# Patient Record
Sex: Male | Born: 1950 | Race: White | Hispanic: No | Smoking: Never smoker
Health system: Southern US, Community
[De-identification: ages and names within clinical notes are randomized; demographics above are authoritative.]

## PROBLEM LIST (undated history)

## (undated) DIAGNOSIS — I7101 Dissection of ascending aorta: Secondary | ICD-10-CM

## (undated) DIAGNOSIS — I10 Essential (primary) hypertension: Secondary | ICD-10-CM

## (undated) DIAGNOSIS — R55 Syncope and collapse: Secondary | ICD-10-CM

## (undated) DIAGNOSIS — E785 Hyperlipidemia, unspecified: Secondary | ICD-10-CM

## (undated) HISTORY — DX: Essential (primary) hypertension: I10

## (undated) HISTORY — DX: Syncope and collapse: R55

## (undated) HISTORY — DX: Hyperlipidemia, unspecified: E78.5

## (undated) HISTORY — DX: Dissection of ascending aorta: I71.010

## (undated) HISTORY — PX: CATARACT EXTRACTION: SUR2

## (undated) HISTORY — DX: Dissection of thoracic aorta: I71.01

---

## 2006-06-27 HISTORY — PX: BENTALL PROCEDURE: SHX5058

## 2006-07-08 ENCOUNTER — Inpatient Hospital Stay (HOSPITAL_COMMUNITY): Admission: EM | Admit: 2006-07-08 | Discharge: 2006-07-13 | Payer: Self-pay | Admitting: Emergency Medicine

## 2007-07-11 IMAGING — CR DG CHEST 1V PORT
1 series · 1 of 1 positions shown · non-contrast
Comparison: none

CLINICAL DATA: Chest pain.  Thoracic dissection.  
 PORTABLE CHEST ? 1 VIEW, 07/10/06, 5855 HOURS:

[view not recorded]
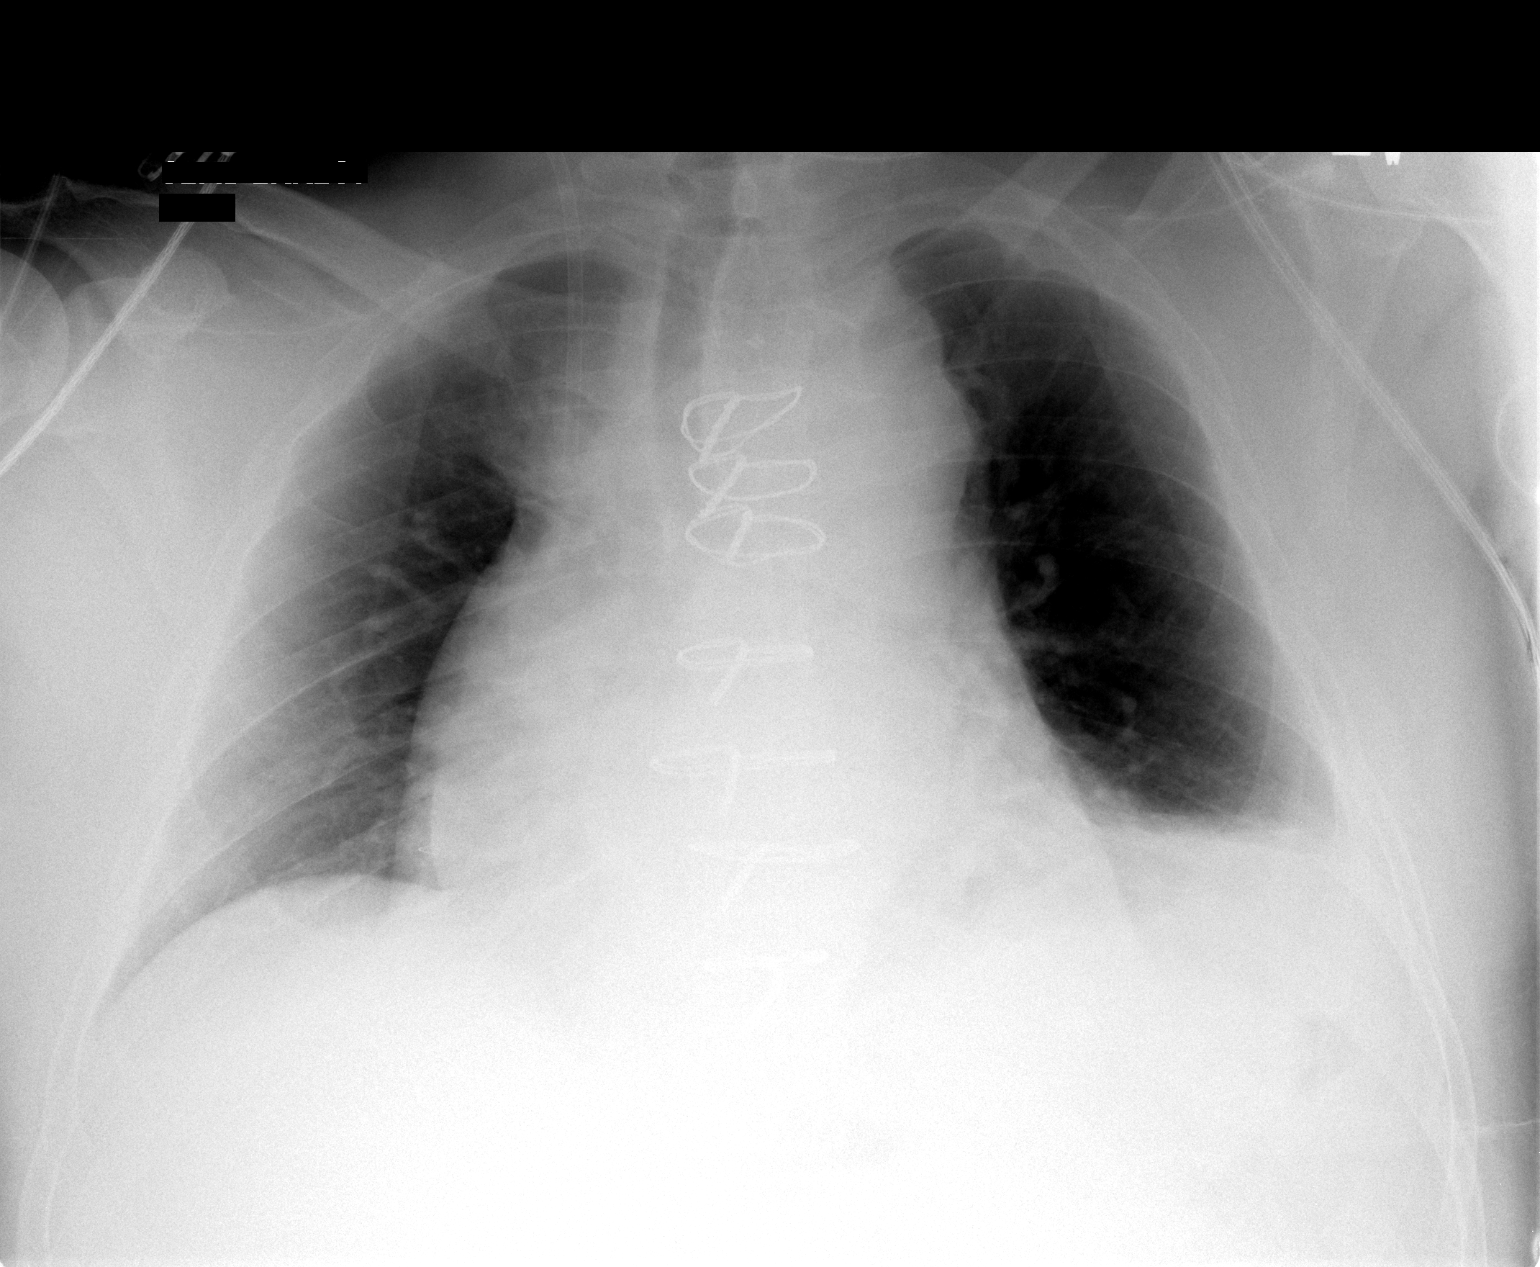

[1 of 1 positions shown; findings below may reference images not displayed]

FINDINGS: Left base subsegmental atelectasis is again noted.  Aeration at the left base is minimally improved.  No congestive heart failure.   Prominent caliber mediastinal silhouette is again noted.  Swan-Ganz catheter has been removed with catheter sheath noted in the right jugular and innominate veins.  Mediastinal chest tubes removed.
IMPRESSION: Mild left base atelectasis.  No pneumothorax.

## 2010-11-15 ENCOUNTER — Other Ambulatory Visit: Payer: Self-pay | Admitting: Cardiology

## 2010-11-16 NOTE — Telephone Encounter (Signed)
Refused refill. Now lives in [place]. Have not seen since 2010. Advised pharm for him to get refill from PCP or cardiologist in [place]

## 2011-11-02 ENCOUNTER — Encounter: Payer: Self-pay | Admitting: *Deleted

## 2012-03-06 ENCOUNTER — Encounter: Payer: Self-pay | Admitting: Cardiology

## 2012-03-07 ENCOUNTER — Encounter: Payer: Self-pay | Admitting: Cardiology

## 2014-10-13 ENCOUNTER — Telehealth: Payer: Self-pay | Admitting: Cardiology

## 2014-10-13 NOTE — Telephone Encounter (Signed)
Mr. Ryan Peterson is calling because he now lives in [place] and would like to know if Dr. [place]Jordan can review his case . Please call - his number is [phone]  Thanks

## 2014-10-14 NOTE — Telephone Encounter (Signed)
Returned call to patient 10/13/14.Stated he is living in [place].Stated he is seeing a cardiologist there and a Ct scan recently done revealing 2 new aneurysms.One is 3.5 cm and other one 4.1 cm.Stated was told to have repeat in 1 year.Patient stated given his past history he wanted Dr.Jordan to call him for a 2nd opinion.Advised Dr.Jordan has not seen you since 2008.Advised he will need to schedule appointment.New patent appointment scheduled with Dr.Jordan 12/05/14 at 2:30 pm.Stated he will bring records.[facility].

## 2014-10-22 NOTE — Telephone Encounter (Signed)
[facility].

## 2014-12-05 ENCOUNTER — Ambulatory Visit: Payer: Self-pay | Admitting: Cardiology

## 2015-02-05 ENCOUNTER — Encounter: Payer: Self-pay | Admitting: Cardiology

## 2015-02-05 ENCOUNTER — Ambulatory Visit (INDEPENDENT_AMBULATORY_CARE_PROVIDER_SITE_OTHER): Payer: PRIVATE HEALTH INSURANCE | Admitting: Cardiology

## 2015-02-05 VITALS — BP 110/70 | HR 67 | Ht 70.0 in | Wt 238.0 lb

## 2015-02-05 DIAGNOSIS — E785 Hyperlipidemia, unspecified: Secondary | ICD-10-CM | POA: Diagnosis not present

## 2015-02-05 DIAGNOSIS — Z9889 Other specified postprocedural states: Secondary | ICD-10-CM | POA: Diagnosis not present

## 2015-02-05 DIAGNOSIS — I10 Essential (primary) hypertension: Secondary | ICD-10-CM | POA: Diagnosis not present

## 2015-02-05 DIAGNOSIS — I7101 Dissection of ascending aorta: Secondary | ICD-10-CM | POA: Insufficient documentation

## 2015-02-06 NOTE — Progress Notes (Signed)
Cardiology Office Note   Date:  02/06/2015   ID:  Ryan Peterson, DOB 05-30-1951, MRN [id number]  PCP:  No primary care provider on file.  Cardiologist:   Harman Langhans [place], MD   Chief Complaint  Patient presents with  . New Evaluation    wants to talk about CT scan results  . Dizziness    adjusting medicine occasionally      History of Present Illness: Ryan Peterson is a 64 y.o. male who presents for evaluation of aortic aneurysm. 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 Laneta Simmers. He recovered well from this procedure and had a normal nuclear stress test in follow up. He since moved to [place] and has been followed there by Dr. Wilfred Curtis at [facility]. He reports he has done very well without symptoms of chest or back pain. No SOB, palpitations, or edema. 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 Echo showed some asymmetric septal hypertrophy with normal EF. Aortic root measured 3.9 cm. Valves were OK. The patient admits that there was a time when he wasn't taking his medication as prescribed and his BP was high. He has more recently been compliant with follow up and meds. He is thinking about moving back to the [place] area when he retires. 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     Past Medical History  Diagnosis Date  . HTN (hypertension)   . Syncope and collapse   . Ascending aortic dissection   . Hyperlipidemia     Past Surgical History  Procedure Laterality Date  . Bentall procedure  2008    Dr. Laneta Simmers  . Cataract extraction       Current Outpatient Prescriptions  Medication Sig Dispense Refill  . amLODipine (NORVASC) 2.5 MG tablet Take 2.5 mg by mouth daily.    Marland Kitchen aspirin 325 MG tablet Take 325 mg by  mouth daily.    Marland Kitchen atorvastatin (LIPITOR) 10 MG tablet Take 10 mg by mouth daily.    . carvedilol (COREG) 6.25 MG tablet Take 6.25 mg by mouth 2 (two) times daily with a meal.    . lisinopril (PRINIVIL,ZESTRIL) 40 MG tablet Take 40 mg by mouth daily.     No current facility-administered medications for this visit.    Allergies:   Review of patient's allergies indicates no known allergies.    Social History:  The patient  reports that he has never smoked. He does not have any smokeless tobacco history on file.   Family History:  The patient's family history includes Bladder Cancer in his mother; Diabetes in his father; Heart attack in his father; Kidney disease in his mother; Obesity in his father.    ROS:  Please see the history of present illness.   Otherwise, review of systems are positive for none.   All other systems are reviewed and negative.    PHYSICAL EXAM: VS:  BP 110/70 mmHg  Pulse 67  Ht  (1.778 m)  Wt 107.956 kg (238 lb)  BMI 34.15 kg/m2 , BMI Body mass index is 34.15 kg/(m^2). GEN: Well nourished, well developed, in no acute distress HEENT: normal Neck: no JVD, carotid bruits, or masses Cardiac: RRR; no murmurs, rubs, or gallops,no edema  Respiratory:  clear to auscultation bilaterally, normal work of breathing GI: soft, nontender, nondistended, + BS MS: no deformity or atrophy Skin: warm and dry, no rash Neuro:  Strength and sensation are intact Psych: euthymic mood, full affect   EKG:  EKG is ordered today. The ekg ordered today demonstrates NSR with rate 67 bpm. Otherwise normal. I have personally reviewed and interpreted this study.    Recent Labs: No results found for requested labs within last 365 days.    Lipid Panel No results found for: CHOL, TRIG, HDL, CHOLHDL, VLDL, LDLCALC, LDLDIRECT    Wt Readings from Last 3 Encounters:  02/05/15 107.956 kg (238 lb)      Other studies Reviewed: Additional studies/ records that were reviewed today  include: Echo and CT report from [place]. Labs done 10/01/14. Review of the above records demonstrates: Normal chemistry panel.   ASSESSMENT AND PLAN:  1.  Aortic dissection s/p Bentall procedure in 2008. Recent CT showed some aneurysmal dilatation of the ascending aorta 4.6 cm. At this point I have reassured him concerning this. I would recommend repeat CT in one year. If this is in the grafted segment I think there is a lower risk of further enlargement. I have requested a copy of the CT itself to review. Needs to maintain good heart health with good blood pressure control.   2. HTN- excellent control on current medication  3. HL. On statin.   Current medicines are reviewed at length with the patient today.  The patient does not have concerns regarding medicines.  The following changes have been made:  no change  Labs/ tests ordered today include:  Orders Placed This Encounter  Procedures  . EKG 12-Lead     Disposition:   FU prn. Will call if there is a change after reviewing CT.   Signed, Breon Rehm [place], MD  02/06/2015 6:44 PM    [facility] [address] [place], [place], [address] Phone [phone], Fax [phone]

## 2015-03-16 ENCOUNTER — Telehealth: Payer: Self-pay

## 2015-03-16 NOTE — Telephone Encounter (Signed)
03-16-15 Disc Received from [facility] and filed on shelf.Jeneen Rinks
